# Patient Record
Sex: Male | Born: 2000 | Race: White | Hispanic: No | Marital: Single | Smoking: Never smoker
Health system: Southern US, Community
[De-identification: ages and names within clinical notes are randomized; demographics above are authoritative.]

## PROBLEM LIST (undated history)

## (undated) ENCOUNTER — Emergency Department (HOSPITAL_BASED_OUTPATIENT_CLINIC_OR_DEPARTMENT_OTHER): Admission: EM | Discharge status: Absent without leave | Payer: Managed Care, Other (non HMO)

## (undated) DIAGNOSIS — L309 Dermatitis, unspecified: Secondary | ICD-10-CM

## (undated) DIAGNOSIS — F419 Anxiety disorder, unspecified: Secondary | ICD-10-CM

## (undated) DIAGNOSIS — T7840XA Allergy, unspecified, initial encounter: Secondary | ICD-10-CM

## (undated) DIAGNOSIS — R011 Cardiac murmur, unspecified: Secondary | ICD-10-CM

## (undated) HISTORY — DX: Allergy, unspecified, initial encounter: T78.40XA

## (undated) HISTORY — DX: Anxiety disorder, unspecified: F41.9

## (undated) HISTORY — DX: Cardiac murmur, unspecified: R01.1

## (undated) HISTORY — DX: Dermatitis, unspecified: L30.9

---
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
## 2019-07-07 ENCOUNTER — Other Ambulatory Visit (HOSPITAL_BASED_OUTPATIENT_CLINIC_OR_DEPARTMENT_OTHER): Payer: Self-pay | Admitting: Family Medicine

## 2019-07-07 DIAGNOSIS — N50812 Left testicular pain: Secondary | ICD-10-CM

## 2019-07-07 DIAGNOSIS — N5089 Other specified disorders of the male genital organs: Secondary | ICD-10-CM

## 2019-07-09 ENCOUNTER — Ambulatory Visit (HOSPITAL_BASED_OUTPATIENT_CLINIC_OR_DEPARTMENT_OTHER)
Admission: RE | Admit: 2019-07-09 | Discharge: 2019-07-09 | Disposition: A | Discharge status: Home / Self Care | Payer: Managed Care, Other (non HMO) | Source: Ambulatory Visit | Attending: Family Medicine | Admitting: Family Medicine

## 2019-07-09 ENCOUNTER — Other Ambulatory Visit: Payer: Self-pay

## 2019-07-09 DIAGNOSIS — N5089 Other specified disorders of the male genital organs: Secondary | ICD-10-CM | POA: Diagnosis present

## 2019-07-09 DIAGNOSIS — N50812 Left testicular pain: Secondary | ICD-10-CM | POA: Insufficient documentation

## 2020-12-05 IMAGING — US US SCROTUM W/ DOPPLER COMPLETE
1 series · 14 of 25 positions shown · non-contrast
Comparison: None.

CLINICAL DATA: Spur attic bilateral testicular pain for 5 days.

EXAM:
SCROTAL ULTRASOUND
DOPPLER ULTRASOUND OF THE TESTICLES
TECHNIQUE: Complete ultrasound examination of the testicles, epididymis, and
other scrotal structures was performed. Color and spectral Doppler
ultrasound were also utilized to evaluate blood flow to the
testicles.

[Series 1: us scrotum w/ doppler complete · 14 of 59 slices shown]
[im 1/59]
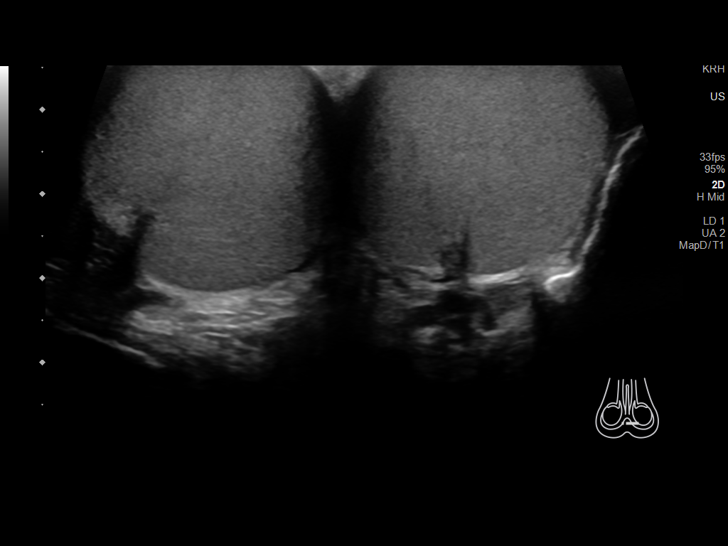
[im 5/59]
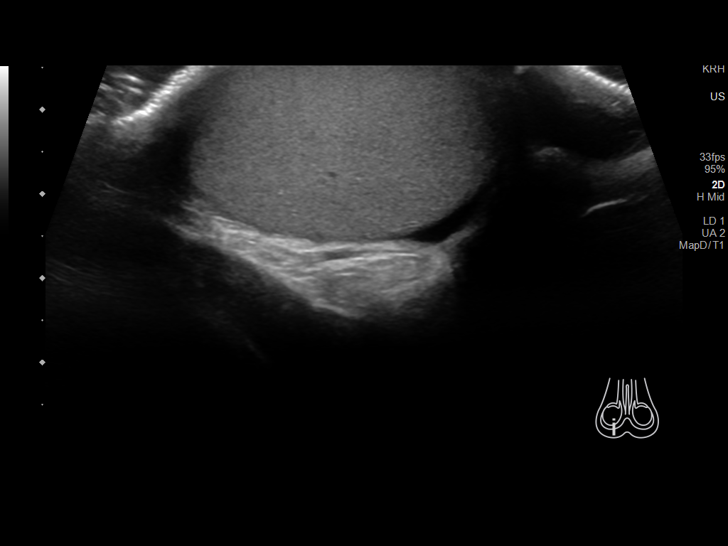
[im 10/59]
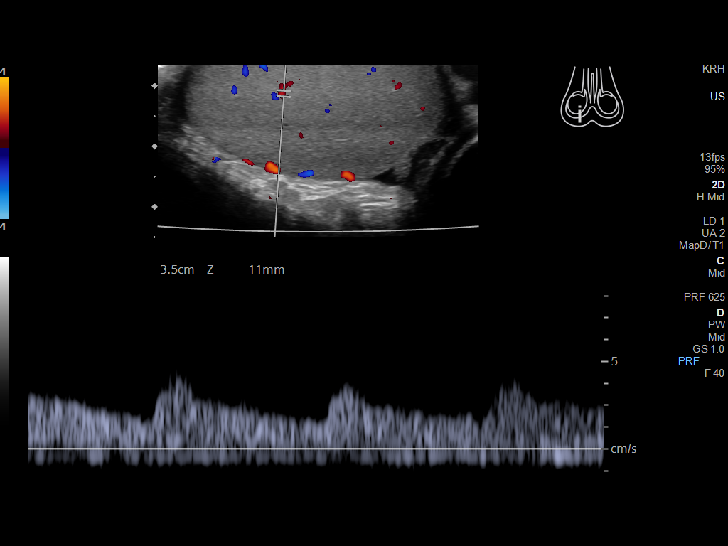
[im 15/59]
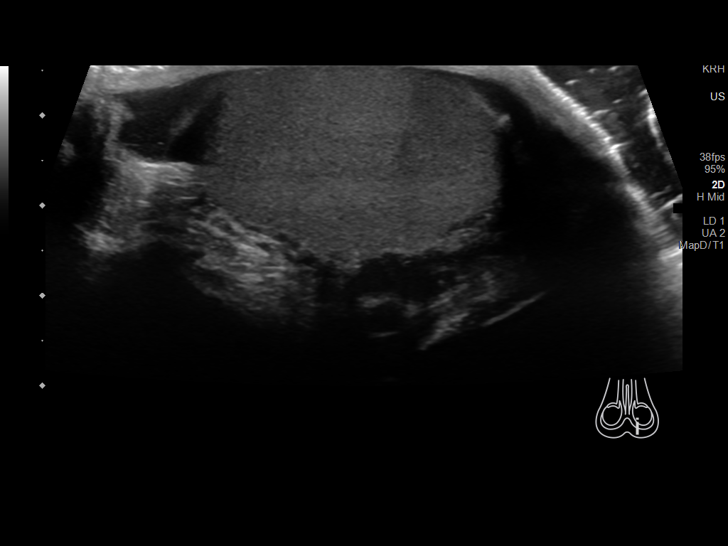
[im 20/59]
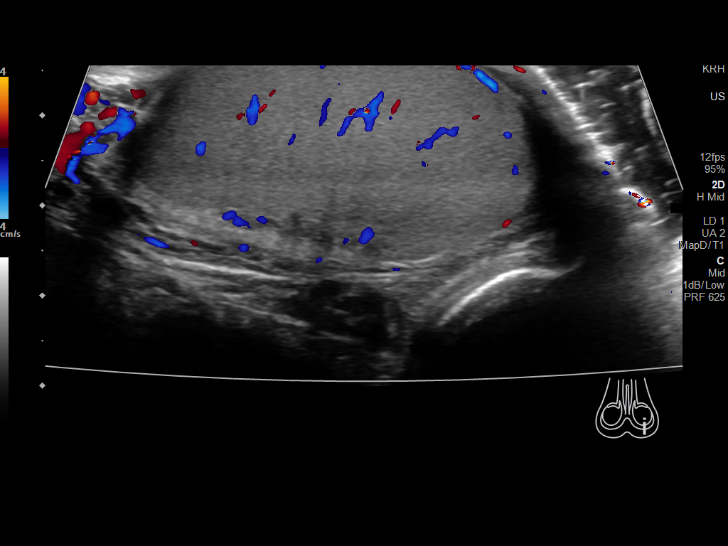
[im 22/59]
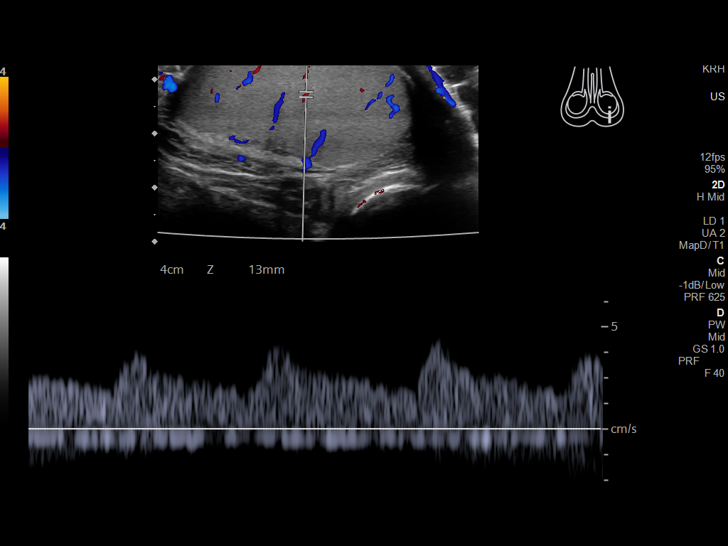
[im 27/59]
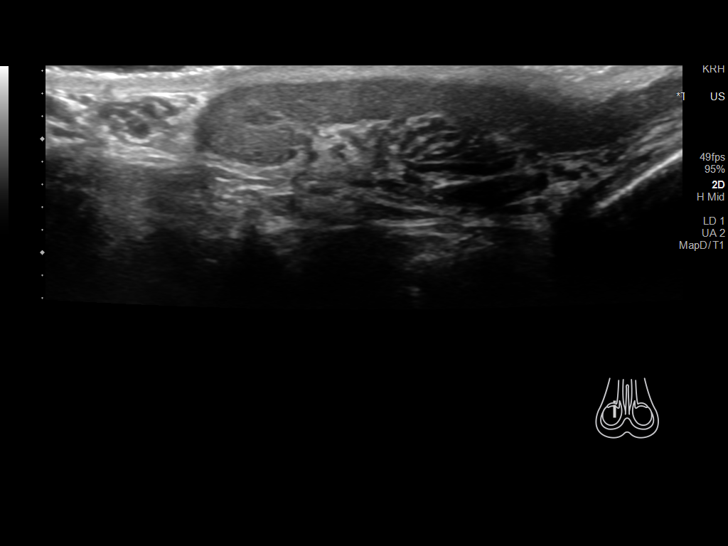
[im 32/59]
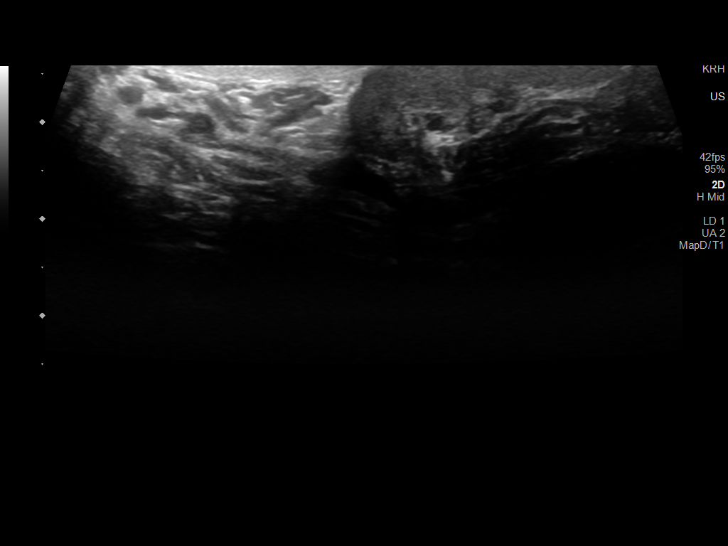
[im 37/59]
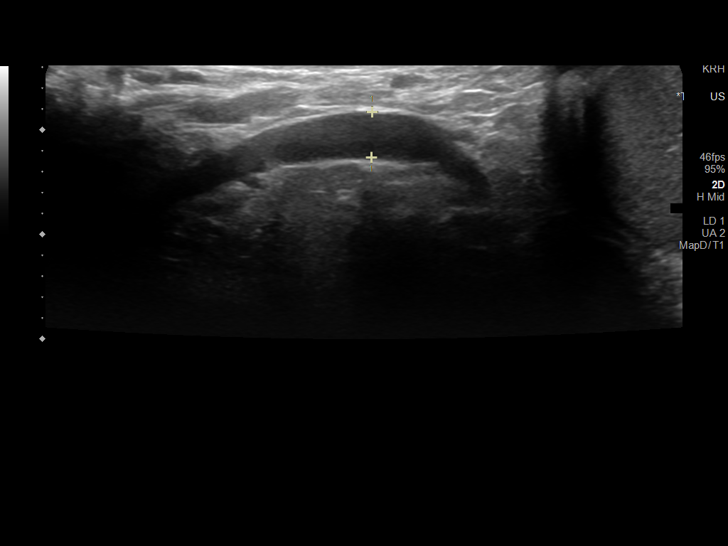
[im 39/59]
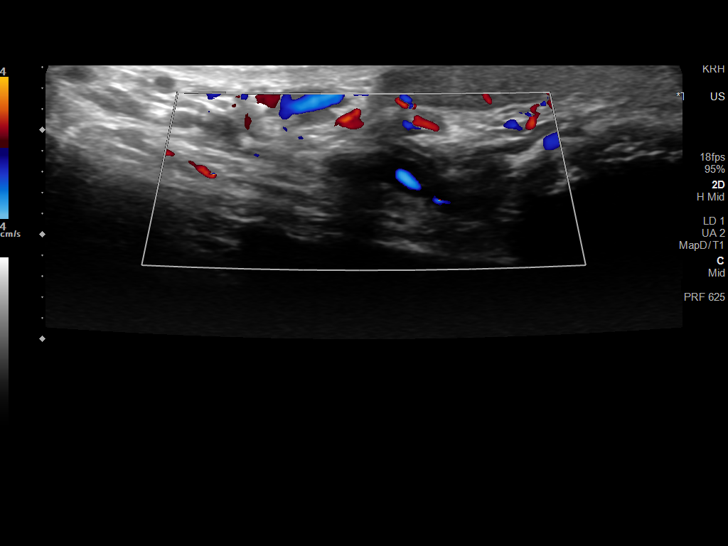
[im 44/59]
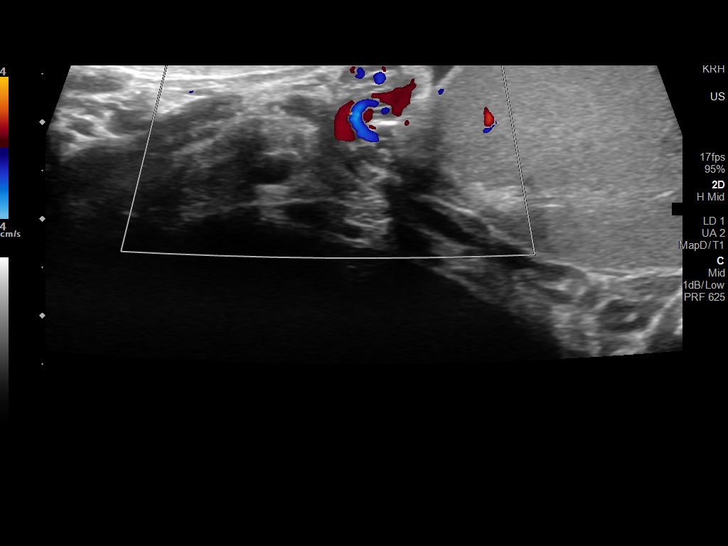
[im 49/59]
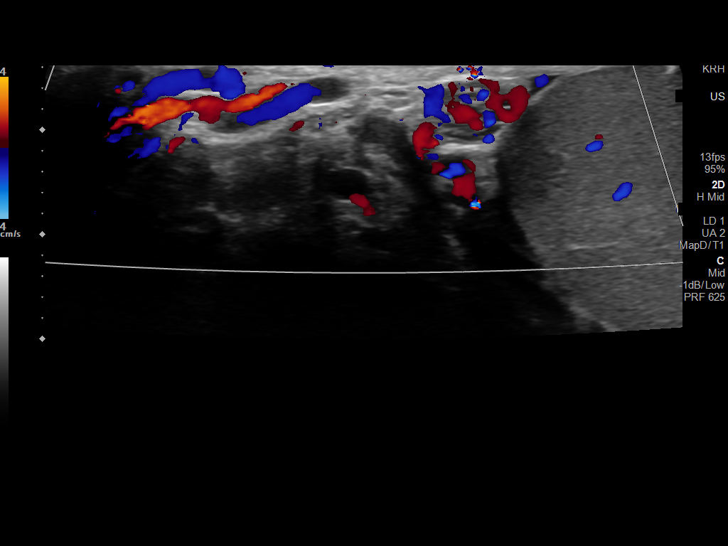
[im 54/59]
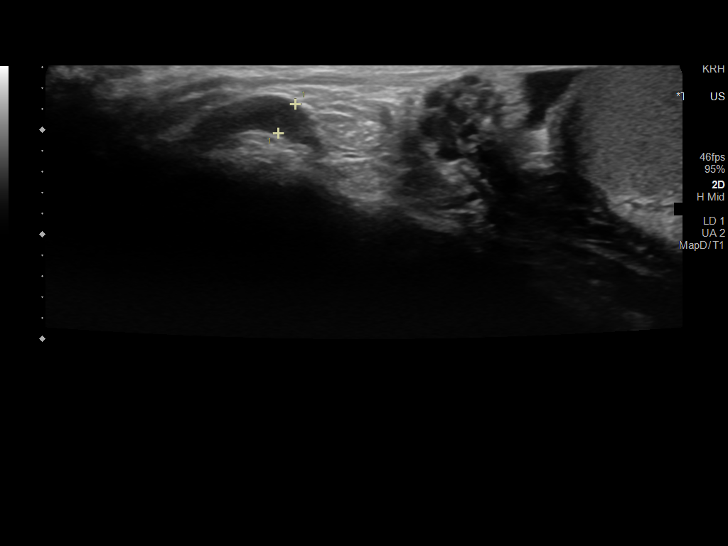
[im 59/59]
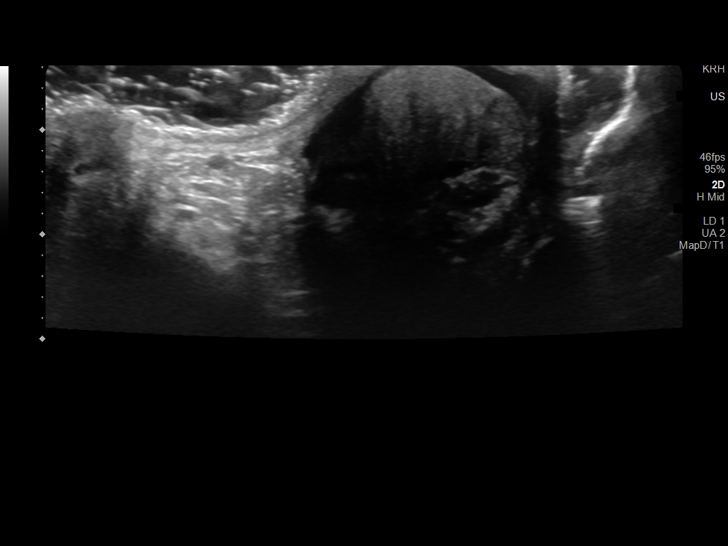

[14 of 25 positions shown; findings below may reference images not displayed]

FINDINGS: Right testicle

Measurements: 4.6 x 2.6 x 3.1 cm. Symmetric and homogeneous
echotexture without focal lesion. Patent arterial and venous blood
flow.

Left testicle

Measurements: 4.7 x 2.7 x 3.0 cm. Symmetric and homogeneous
echotexture without focal lesion. Patent arterial and venous blood
flow.

Right epididymis:  Normal in size and appearance.

Left epididymis:  Normal in size and appearance.

Hydrocele:  Small hydrocele on the left.

Varicocele:  Bilateral varicoceles.

Pulsed Doppler interrogation of both testes demonstrates normal low
resistance arterial and venous waveforms bilaterally.
IMPRESSION: 1. Normal sonographic appearance of both testicles.
2. Bilateral varicoceles and small left-sided hydrocele.

## 2022-04-17 ENCOUNTER — Ambulatory Visit (INDEPENDENT_AMBULATORY_CARE_PROVIDER_SITE_OTHER): Payer: BC Managed Care – PPO | Admitting: Medical

## 2022-04-17 ENCOUNTER — Encounter: Payer: Self-pay | Admitting: Medical

## 2022-04-17 VITALS — BP 130/78 | HR 75 | Temp 97.8°F | Resp 18 | Ht 69.0 in | Wt 151.6 lb

## 2022-04-17 DIAGNOSIS — R0789 Other chest pain: Secondary | ICD-10-CM

## 2022-04-17 DIAGNOSIS — R5383 Other fatigue: Secondary | ICD-10-CM

## 2022-04-17 DIAGNOSIS — R002 Palpitations: Secondary | ICD-10-CM

## 2022-04-17 DIAGNOSIS — F419 Anxiety disorder, unspecified: Secondary | ICD-10-CM | POA: Diagnosis not present

## 2022-04-17 NOTE — Progress Notes (Signed)
Subjective:    Patient ID: Russell Stevens, male    DOB: [birth year], 21 y.o.   MRN: [id number]  HPI Pt in for first time.  Pt works for [employer] [place]. Works collections. On job training. He exercises 5-6 days a week. He states eats healthy.  No alcohol. Non smoker. Smoked marijuana in college. He stopped smoking marijuana in past. Stopped edible 2 months. Some acid and mushrooms in college.   No coffee and no sodas.   [employer]- bachelor of science in psychology..    Pt states he gets palpitation on and off 2 moths ago. When this occurs feels like heart beat in throat.  Occurs once every other week.  Pt states chest pain on and off since January. Twice a weeks. Pain brief and transient chest pain at rest and with activity. At most last 5-10 minutes. No cocaine use.  Last times had chest pain was 2 days ago. Last time had palpitation was 2 weeks ago.  Pt states he feels anxiety but not have heart symptoms associated anxiety.  Also have some eczema. He also has eczema on elbos.     Review of Systems  Constitutional:  Positive for fatigue. Negative for chills and fever.       Mild tired.  Respiratory:  Negative for cough, choking, shortness of breath and wheezing.   Cardiovascular:  Negative for chest pain and palpitations.  Gastrointestinal:  Negative for blood in stool, diarrhea and rectal pain.  Genitourinary:  Negative for dysuria and flank pain.  Musculoskeletal:  Negative for back pain, gait problem and joint swelling.  Skin:  Negative for rash.  Neurological:  Negative for dizziness, light-headedness and headaches.  Hematological:  Negative for adenopathy. Does not bruise/bleed easily.  Psychiatric/Behavioral:  Negative for agitation, behavioral problems, sleep disturbance and suicidal ideas. The patient is nervous/anxious.     Past Medical History:  Diagnosis Date   Allergy    Anxiety    Eczema    Heart murmur      Social History   Socioeconomic History    Marital status: Single    Spouse name: Not on file   Number of children: Not on file   Years of education: Not on file   Highest education level: Bachelor's degree (e.g., BA, AB, BS)  Occupational History   Not on file  Tobacco Use   Smoking status: Never   Smokeless tobacco: Not on file  Vaping Use   Vaping Use: Never used  Substance and Sexual Activity   Alcohol use: Not Currently   Drug use: Not Currently    Types: LSD, Marijuana   Sexual activity: Yes    Birth control/protection: Condom  Other Topics Concern   Not on file  Social History Narrative   Not on file   Social Determinants of Health   Financial Resource Strain: Not on file  Food Insecurity: Not on file  Transportation Needs: Not on file  Physical Activity: Not on file  Stress: Not on file  Social Connections: Not on file  Intimate Partner Violence: Not on file    History reviewed. No pertinent surgical history.  Family History  Problem Relation Age of Onset   Depression Mother    Hypertension Father    Hypertension Maternal Grandmother    Cancer Maternal Grandmother    Hypertension Maternal Grandfather    Cancer Maternal Grandfather    Hypertension Paternal Grandmother    Hyperlipidemia Paternal Grandfather    Hypertension Paternal Actor  Allergies  Allergen Reactions   Amoxicillin-Pot Clavulanate Other (See Comments)    diarrhea   Peanut (Diagnostic) Rash    Per mom Per mom     Current Outpatient Medications on File Prior to Visit  Medication Sig Dispense Refill   cholecalciferol (VITAMIN D3) 25 MCG (1000 UNIT) tablet Take 1,000 Units by mouth daily.     Magnesium Bisglycinate (MAG GLYCINATE PO) Take by mouth.     No current facility-administered medications on file prior to visit.    BP 130/78 (BP Location: Left Arm, Patient Position: Sitting, Cuff Size: Normal)   Pulse 75   Temp 97.8 F (36.6 C) (Temporal)   Resp 18   Ht 5\' 9"  (1.753 m)   Wt 151 lb 9.6 oz (68.8 kg)    SpO2 98%   BMI 22.39 kg/m        Objective:   Physical Exam   General- No acute distress. Pleasant patient. Neck- Full range of motion, no jvd Lungs- Clear, even and unlabored. Heart- regular rate and rhythm. Neurologic- CNII- XII grossly intact.      Assessment & Plan:   Patient Instructions  For atypical chest pain and intermittent palpitation will get ahead and refer you to cardiologist. Avoid any caffeine. If symptoms recurrent prior to cardiology evaluation be seen here, [facility] or ED  For anxiety- offered low dose prescription. You declined so do think counselor would be good idea. Let me know if you change your mind.  For family histor of thyroid disease and mild tired/fatigue placed labs.  Follow up in 2-3 weeks or sooner if needed.

## 2022-04-17 NOTE — Patient Instructions (Addendum)
For atypical chest pain and intermittent palpitation will get ahead and refer you to cardiologist. Avoid any caffeine. If symptoms recurrent prior to cardiology evaluation be seen here, [facility] or ED  Ekg nsr today.  For anxiety- offered low dose prescription. You declined so do think counselor would be good idea. Let me know if you change your mind.  For family history of thyroid disease and mild tired/fatigue placed labs.  Follow up in 2-3 weeks or sooner if needed.

## 2022-04-17 NOTE — Addendum Note (Signed)
Addended by: Mervin Kung A on: 04/17/2022 03:15 PM   Modules accepted: Orders

## 2022-04-18 LAB — CBC WITH DIFFERENTIAL/PLATELET
Absolute Monocytes: 286 cells/uL (ref 200–950)
Basophils Absolute: 20 cells/uL (ref 0–200)
Basophils Relative: 0.4 %
Eosinophils Absolute: 82 cells/uL (ref 15–500)
Eosinophils Relative: 1.6 %
HCT: 43.6 % (ref 38.5–50.0)
Hemoglobin: 14.9 g/dL (ref 13.2–17.1)
Lymphs Abs: 2224 cells/uL (ref 850–3900)
MCH: 29.4 pg (ref 27.0–33.0)
MCHC: 34.2 g/dL (ref 32.0–36.0)
MCV: 86 fL (ref 80.0–100.0)
MPV: 9.9 fL (ref 7.5–12.5)
Monocytes Relative: 5.6 %
Neutro Abs: 2489 cells/uL (ref 1500–7800)
Neutrophils Relative %: 48.8 %
Platelets: 253 10*3/uL (ref 140–400)
RBC: 5.07 10*6/uL (ref 4.20–5.80)
RDW: 13 % (ref 11.0–15.0)
Total Lymphocyte: 43.6 %
WBC: 5.1 10*3/uL (ref 3.8–10.8)

## 2022-04-18 LAB — COMPREHENSIVE METABOLIC PANEL
AG Ratio: 1.9 (calc) (ref 1.0–2.5)
ALT: 13 U/L (ref 9–46)
AST: 13 U/L (ref 10–40)
Albumin: 4.5 g/dL (ref 3.6–5.1)
Alkaline phosphatase (APISO): 86 U/L (ref 36–130)
BUN: 14 mg/dL (ref 7–25)
CO2: 28 mmol/L (ref 20–32)
Calcium: 9.4 mg/dL (ref 8.6–10.3)
Chloride: 103 mmol/L (ref 98–110)
Creat: 0.77 mg/dL (ref 0.60–1.24)
Globulin: 2.4 g/dL (calc) (ref 1.9–3.7)
Glucose, Bld: 68 mg/dL (ref 65–99)
Potassium: 4.2 mmol/L (ref 3.5–5.3)
Sodium: 140 mmol/L (ref 135–146)
Total Bilirubin: 0.5 mg/dL (ref 0.2–1.2)
Total Protein: 6.9 g/dL (ref 6.1–8.1)

## 2022-04-18 LAB — TSH: TSH: 1.74 mIU/L (ref 0.40–4.50)

## 2022-04-18 LAB — T4, FREE: Free T4: 1.4 ng/dL (ref 0.8–1.8)

## 2022-05-05 ENCOUNTER — Ambulatory Visit: Payer: BC Managed Care – PPO | Attending: Internal Medicine | Admitting: Internal Medicine

## 2022-05-05 VITALS — BP 118/84 | HR 70 | Ht 69.0 in | Wt 149.2 lb

## 2022-05-05 DIAGNOSIS — R079 Chest pain, unspecified: Secondary | ICD-10-CM | POA: Diagnosis not present

## 2022-05-05 NOTE — Progress Notes (Signed)
Cardiology Office Note:    Date:  05/05/2022   ID:  Russell Stevens, DOB Feb 09, 2001, MRN [id number]  PCP:  Saguier, Edward, [place] [facility] Cardiologist:  None     Referring MD: Mackie Pai, PA-C   No chief complaint on file. Atypical CP  History of Present Illness:    Russell Stevens is a 21 y.o. male with no cardiac hx, referral for palpitations and atypical intermittent CP. He has anxious signs.He notes some fluttering. He was having panic attacks. He is working on therapy. Qtc 384 ms. No syncope  Past Medical History:  Diagnosis Date   Allergy    Anxiety    Eczema    Heart murmur     No past surgical history on file.  Current Medications: No outpatient medications have been marked as taking for the 05/05/22 encounter (Appointment) with Janina Mayo, MD.     Allergies:   Amoxicillin-pot clavulanate and Peanut (diagnostic)   Social History   Socioeconomic History   Marital status: Single    Spouse name: Not on file   Number of children: Not on file   Years of education: Not on file   Highest education level: Bachelor's degree (e.g., BA, AB, BS)  Occupational History   Not on file  Tobacco Use   Smoking status: Never   Smokeless tobacco: Not on file  Vaping Use   Vaping Use: Never used  Substance and Sexual Activity   Alcohol use: Not Currently   Drug use: Not Currently    Types: LSD, Marijuana   Sexual activity: Yes    Birth control/protection: Condom  Other Topics Concern   Not on file  Social History Narrative   Not on file   Social Determinants of Health   Financial Resource Strain: Not on file  Food Insecurity: Not on file  Transportation Needs: Not on file  Physical Activity: Not on file  Stress: Not on file  Social Connections: Not on file     Family History: The patient's family history includes Cancer in his maternal grandfather and maternal grandmother; Depression in his mother; Hyperlipidemia in his paternal  grandfather; Hypertension in his father, maternal grandfather, maternal grandmother, paternal grandfather, and paternal grandmother. No family hx of CAD  ROS:   Please see the history of present illness.     All other systems reviewed and are negative.  EKGs/Labs/Other Studies Reviewed:    The following studies were reviewed today:  EKG:  EKG is  ordered today.  The ekg ordered today demonstrates   10/3-NSR  Recent Labs: 04/17/2022: ALT 13; BUN 14; Creat 0.77; Hemoglobin 14.9; Platelets 253; Potassium 4.2; Sodium 140; TSH 1.74   Recent Lipid Panel No results found for: "CHOL", "TRIG", "HDL", "CHOLHDL", "VLDL", "LDLCALC", "LDLDIRECT"   Risk Assessment/Calculations:     Physical Exam:    VS:  Vitals:   05/05/22 0914 05/05/22 0915  BP: 118/84 118/84  Pulse:  70     Wt Readings from Last 3 Encounters:  04/17/22 151 lb 9.6 oz (68.8 kg)  12/22/12 116 lb (52.6 kg) (68 %, Z= 0.46)*  10/15/11 100 lb (45.4 kg) (66 %, Z= 0.41)*   * Growth percentiles are based on CDC (Boys, 2-20 Years) data.     GEN:  Well nourished, well developed in no acute distress HEENT: Normal NECK: No JVD; No carotid bruits LYMPHATICS: No lymphadenopathy CARDIAC: RRR, no murmurs, rubs, gallops RESPIRATORY:  Clear to auscultation without rales, wheezing or rhonchi  ABDOMEN:  Soft, non-tender, non-distended MUSCULOSKELETAL:  No edema; No deformity  SKIN: Warm and dry NEUROLOGIC:  Alert and oriented x 3 PSYCHIATRIC:  Normal affect   ASSESSMENT:    Non cardiac CP: has intermittent CP. The nature of the pain is very atypical. Her EKG today was normal. Consider the low pretest probability and atypical pain will not recommend further testing.  Palpitations: He does not have high risk features including syncope c/f arrhythmia , family hx of SCD, or abnormalities on her EKG.  PLAN:    In order of problems listed above:  No further cardiac w/u           Medication Adjustments/Labs and Tests  Ordered: Current medicines are reviewed at length with the patient today.  Concerns regarding medicines are outlined above.  No orders of the defined types were placed in this encounter.  No orders of the defined types were placed in this encounter.   There are no Patient Instructions on file for this visit.   Signed, Maisie Fus, MD  05/05/2022 7:49 AM    [facility]

## 2022-05-05 NOTE — Patient Instructions (Signed)
Medication Instructions:  NO CHANGES  *If you need a refill on your cardiac medications before your next appointment, please call your pharmacy*    Follow-Up: At [facility], you and your health needs are our priority.  As part of our continuing mission to provide you with exceptional heart care, we have created designated Provider Care Teams.  These Care Teams include your primary Cardiologist (physician) and Advanced Practice Providers (APPs -  Physician Assistants and Nurse Practitioners) who all work together to provide you with the care you need, when you need it.  We recommend signing up for the patient portal called "MyChart".  Sign up information is provided on this After Visit Summary.  MyChart is used to connect with patients for Virtual Visits (Telemedicine).  Patients are able to view lab/test results, encounter notes, upcoming appointments, etc.  Non-urgent messages can be sent to your provider as well.   To learn more about what you can do with MyChart, go to [email or web address].    Your next appointment:    AS NEEDED with Dr. Phineas Inches

## 2022-06-01 DIAGNOSIS — F411 Generalized anxiety disorder: Secondary | ICD-10-CM | POA: Diagnosis not present

## 2022-06-29 DIAGNOSIS — F411 Generalized anxiety disorder: Secondary | ICD-10-CM | POA: Diagnosis not present

## 2022-07-20 DIAGNOSIS — F411 Generalized anxiety disorder: Secondary | ICD-10-CM | POA: Diagnosis not present

## 2022-08-03 DIAGNOSIS — F411 Generalized anxiety disorder: Secondary | ICD-10-CM | POA: Diagnosis not present

## 2022-08-17 DIAGNOSIS — F411 Generalized anxiety disorder: Secondary | ICD-10-CM | POA: Diagnosis not present

## 2022-08-31 DIAGNOSIS — F411 Generalized anxiety disorder: Secondary | ICD-10-CM | POA: Diagnosis not present

## 2022-09-12 DIAGNOSIS — S0101XA Laceration without foreign body of scalp, initial encounter: Secondary | ICD-10-CM | POA: Diagnosis not present

## 2022-09-12 DIAGNOSIS — S0990XA Unspecified injury of head, initial encounter: Secondary | ICD-10-CM | POA: Diagnosis not present

## 2022-09-14 DIAGNOSIS — F411 Generalized anxiety disorder: Secondary | ICD-10-CM | POA: Diagnosis not present

## 2022-09-28 DIAGNOSIS — F411 Generalized anxiety disorder: Secondary | ICD-10-CM | POA: Diagnosis not present

## 2022-10-12 DIAGNOSIS — F411 Generalized anxiety disorder: Secondary | ICD-10-CM | POA: Diagnosis not present

## 2022-10-26 DIAGNOSIS — F411 Generalized anxiety disorder: Secondary | ICD-10-CM | POA: Diagnosis not present

## 2022-11-09 DIAGNOSIS — F411 Generalized anxiety disorder: Secondary | ICD-10-CM | POA: Diagnosis not present

## 2022-11-23 DIAGNOSIS — F411 Generalized anxiety disorder: Secondary | ICD-10-CM | POA: Diagnosis not present

## 2022-12-07 DIAGNOSIS — F411 Generalized anxiety disorder: Secondary | ICD-10-CM | POA: Diagnosis not present

## 2022-12-21 DIAGNOSIS — F411 Generalized anxiety disorder: Secondary | ICD-10-CM | POA: Diagnosis not present

## 2023-01-04 DIAGNOSIS — F411 Generalized anxiety disorder: Secondary | ICD-10-CM | POA: Diagnosis not present

## 2023-01-18 DIAGNOSIS — F411 Generalized anxiety disorder: Secondary | ICD-10-CM | POA: Diagnosis not present

## 2024-08-22 ENCOUNTER — Ambulatory Visit: Payer: Self-pay | Admitting: Medical

## 2024-08-22 ENCOUNTER — Ambulatory Visit: Admitting: Medical

## 2024-08-22 VITALS — BP 112/78 | HR 79 | Temp 98.3°F | Resp 16 | Ht 69.0 in | Wt 165.6 lb

## 2024-08-22 DIAGNOSIS — L210 Seborrhea capitis: Secondary | ICD-10-CM

## 2024-08-22 DIAGNOSIS — Z0000 Encounter for general adult medical examination without abnormal findings: Secondary | ICD-10-CM

## 2024-08-22 LAB — CBC WITH DIFFERENTIAL/PLATELET
Basophils Absolute: 0 K/uL (ref 0.0–0.1)
Basophils Relative: 0.3 % (ref 0.0–3.0)
Eosinophils Absolute: 0.1 K/uL (ref 0.0–0.7)
Eosinophils Relative: 1.8 % (ref 0.0–5.0)
HCT: 45.6 % (ref 39.0–52.0)
Hemoglobin: 15.7 g/dL (ref 13.0–17.0)
Lymphocytes Relative: 37.9 % (ref 12.0–46.0)
Lymphs Abs: 2 K/uL (ref 0.7–4.0)
MCHC: 34.5 g/dL (ref 30.0–36.0)
MCV: 84.1 fl (ref 78.0–100.0)
Monocytes Absolute: 0.3 K/uL (ref 0.1–1.0)
Monocytes Relative: 5 % (ref 3.0–12.0)
Neutro Abs: 2.9 K/uL (ref 1.4–7.7)
Neutrophils Relative %: 55 % (ref 43.0–77.0)
Platelets: 263 K/uL (ref 150.0–400.0)
RBC: 5.42 Mil/uL (ref 4.22–5.81)
RDW: 12.7 % (ref 11.5–15.5)
WBC: 5.3 K/uL (ref 4.0–10.5)

## 2024-08-22 LAB — LIPID PANEL
Cholesterol: 205 mg/dL — ABNORMAL HIGH (ref 28–200)
HDL: 37.2 mg/dL — ABNORMAL LOW
LDL Cholesterol: 144 mg/dL — ABNORMAL HIGH (ref 10–99)
NonHDL: 168.23
Total CHOL/HDL Ratio: 6
Triglycerides: 121 mg/dL (ref 10.0–149.0)
VLDL: 24.2 mg/dL (ref 0.0–40.0)

## 2024-08-22 LAB — COMPREHENSIVE METABOLIC PANEL WITH GFR
ALT: 30 U/L (ref 3–53)
AST: 18 U/L (ref 5–37)
Albumin: 4.8 g/dL (ref 3.5–5.2)
Alkaline Phosphatase: 87 U/L (ref 39–117)
BUN: 10 mg/dL (ref 6–23)
CO2: 30 meq/L (ref 19–32)
Calcium: 9.6 mg/dL (ref 8.4–10.5)
Chloride: 104 meq/L (ref 96–112)
Creatinine, Ser: 0.84 mg/dL (ref 0.40–1.50)
GFR: 121.54 mL/min
Glucose, Bld: 84 mg/dL (ref 70–99)
Potassium: 4.2 meq/L (ref 3.5–5.1)
Sodium: 140 meq/L (ref 135–145)
Total Bilirubin: 1 mg/dL (ref 0.2–1.2)
Total Protein: 7.5 g/dL (ref 6.0–8.3)

## 2024-08-22 NOTE — Patient Instructions (Addendum)
 For you wellness exam today I have ordered cbc, cmp and lipid panel.  Vaccine declined state flu and covid will get thru pharmacy.  Recommend exercise and healthy diet.  We will let you know lab results as they come in.  Follow up date appointment will be determined after lab review.     Preventive Care 11-26 Years Old, Male Preventive care refers to lifestyle choices and visits with your health care provider that can promote health and wellness. Preventive care visits are also called wellness exams. What can I expect for my preventive care visit? Counseling During your preventive care visit, your health care provider may ask about your: Medical history, including: Past medical problems. Family medical history. Current health, including: Emotional well-being. Home life and relationship well-being. Sexual activity. Lifestyle, including: Alcohol, nicotine or tobacco, and drug use. Access to firearms. Diet, exercise, and sleep habits. Safety issues such as seatbelt and bike helmet use. Sunscreen use. Work and work astronomer. Physical exam Your health care provider may check your: Height and weight. These may be used to calculate your BMI (body mass index). BMI is a measurement that tells if you are at a healthy weight. Waist circumference. This measures the distance around your waistline. This measurement also tells if you are at a healthy weight and may help predict your risk of certain diseases, such as type 2 diabetes and high blood pressure. Heart rate and blood pressure. Body temperature. Skin for abnormal spots. What immunizations do I need?  Vaccines are usually given at various ages, according to a schedule. Your health care provider will recommend vaccines for you based on your age, medical history, and lifestyle or other factors, such as travel or where you work. What tests do I need? Screening Your health care provider may recommend screening tests for certain  conditions. This may include: Lipid and cholesterol levels. Diabetes screening. This is done by checking your blood sugar (glucose) after you have not eaten for a while (fasting). Hepatitis B test. Hepatitis C test. HIV (human immunodeficiency virus) test. STI (sexually transmitted infection) testing, if you are at risk. Talk with your health care provider about your test results, treatment options, and if necessary, the need for more tests. Follow these instructions at home: Eating and drinking  Eat a healthy diet that includes fresh fruits and vegetables, whole grains, lean protein, and low-fat dairy products. Drink enough fluid to keep your urine pale yellow. Take vitamin and mineral supplements as recommended by your health care provider. Do not drink alcohol if your health care provider tells you not to drink. If you drink alcohol: Limit how much you have to 0-2 drinks a day. Know how much alcohol is in your drink. In the [place] one drink equals one 12 oz bottle of beer (355 mL), one 5 oz glass of wine (148 mL), or one 1 oz glass of hard liquor (44 mL). Lifestyle Brush your teeth every morning and night with fluoride toothpaste. Floss one time each day. Exercise for at least 30 minutes 5 or more days each week. Do not use any products that contain nicotine or tobacco. These products include cigarettes, chewing tobacco, and vaping devices, such as e-cigarettes. If you need help quitting, ask your health care provider. Do not use drugs. If you are sexually active, practice safe sex. Use a condom or other form of protection to prevent STIs. Find healthy ways to manage stress, such as: Meditation, yoga, or listening to music. Journaling. Talking to a trusted person.  Spending time with friends and family. Minimize exposure to UV radiation to reduce your risk of skin cancer. Safety Always wear your seat belt while driving or riding in a vehicle. Do not drive: If you have been drinking  alcohol. Do not ride with someone who has been drinking. If you have been using any mind-altering substances or drugs. While texting. When you are tired or distracted. Wear a helmet and other protective equipment during sports activities. If you have firearms in your house, make sure you follow all gun safety procedures. Seek help if you have been physically or sexually abused. What's next? Go to your health care provider once a year for an annual wellness visit. Ask your health care provider how often you should have your eyes and teeth checked. Stay up to date on all vaccines. This information is not intended to replace advice given to you by your health care provider. Make sure you discuss any questions you have with your health care provider. Document Revised: 01/15/2021 Document Reviewed: 01/15/2021 Elsevier Patient Education  2024 [employer].

## 2024-08-22 NOTE — Addendum Note (Signed)
 Addended by: DORINA DALLAS DORINA PA-C M on: 08/22/2024 10:50 AM   Modules accepted: Orders

## 2024-08-22 NOTE — Progress Notes (Addendum)
 "  Subjective:    Patient ID: Russell Stevens, male    DOB: 05/12/2001, 24 y.o.   MRN: [id number]  HPI Pt in for wellness exam   Pt works for [employer]. Works collections.  Not exercising presently. He states eats moderate healthy.  No alcohol. Non smoker. No drug use on review    Coffees occasional and no sodas.    [employer]- barista in psychology.  Pt states will get flu vaccine at pharmacy. Plans to do covid at pharmacy as well.    08-29-2024. Opened up chart and deleted portion that did not apply to pt. Somehow accidentally past item in patient charge. So deleting. Also checked making sure pt did not have similar clnical presentation. He states not.        Review of Systems  Constitutional:  Negative for chills, fatigue and fever.  HENT:  Negative for congestion and ear pain.   Respiratory:  Negative for chest tightness, shortness of breath and wheezing.   Cardiovascular:  Negative for chest pain and palpitations.  Gastrointestinal:  Negative for abdominal pain, blood in stool, diarrhea, nausea and vomiting.  Genitourinary:  Negative for dysuria, frequency and testicular pain.  Musculoskeletal:  Negative for back pain, myalgias and neck stiffness.  Skin:  Negative for rash.       Dandruff. Has tried various over the counter measures but has not helped. Also dry flaky skin on face. Some scaly dryness in eye brows as well.  Neurological:  Negative for dizziness, seizures, syncope, weakness and headaches.  Hematological:  Negative for adenopathy.  Psychiatric/Behavioral:  Negative for behavioral problems, dysphoric mood and suicidal ideas. The patient is not nervous/anxious.      Past Medical History:  Diagnosis Date   Allergy    Anxiety    Eczema    Heart murmur      Social History   Socioeconomic History   Marital status: Single    Spouse name: Not on file   Number of children: Not on file   Years of education: Not on file   Highest education  level: Bachelor's degree (e.g., BA, AB, BS)  Occupational History   Not on file  Tobacco Use   Smoking status: Never   Smokeless tobacco: Not on file  Vaping Use   Vaping status: Never Used  Substance and Sexual Activity   Alcohol use: Not Currently   Drug use: Not Currently    Types: LSD, Marijuana   Sexual activity: Yes    Birth control/protection: Condom  Other Topics Concern   Not on file  Social History Narrative   Not on file   Social Drivers of Health   Tobacco Use: Unknown (08/16/2024)   Patient History    Smoking Tobacco Use: Never    Smokeless Tobacco Use: Unknown    Passive Exposure: Not on file  Financial Resource Strain: Low Risk (08/16/2024)   Overall Financial Resource Strain (CARDIA)    Difficulty of Paying Living Expenses: Not very hard  Food Insecurity: No Food Insecurity (08/16/2024)   Epic    Worried About Programme Researcher, Broadcasting/film/video in the Last Year: Never true    Ran Out of Food in the Last Year: Never true  Transportation Needs: No Transportation Needs (08/16/2024)   Epic    Lack of Transportation (Medical): No    Lack of Transportation (Non-Medical): No  Physical Activity: Inactive (08/16/2024)   Exercise Vital Sign    Days of Exercise per Week: 0 days  Minutes of Exercise per Session: Not on file  Stress: Stress Concern Present (08/16/2024)   [employer] of Occupational Health - Occupational Stress Questionnaire    Feeling of Stress: To some extent  Social Connections: Socially Isolated (08/16/2024)   Social Connection and Isolation Panel    Frequency of Communication with Friends and Family: More than three times a week    Frequency of Social Gatherings with Friends and Family: Twice a week    Attends Religious Services: Never    Database Administrator or Organizations: No    Attends Engineer, Structural: Not on file    Marital Status: Never married  Intimate Partner Violence: Not on file  Depression (PHQ2-9): Medium Risk (08/22/2024)    Depression (PHQ2-9)    PHQ-2 Score: 9  Alcohol Screen: Low Risk (08/16/2024)   Alcohol Screen    Last Alcohol Screening Score (AUDIT): 2  Housing: Low Risk (08/16/2024)   Epic    Unable to Pay for Housing in the Last Year: No    Number of Times Moved in the Last Year: 0    Homeless in the Last Year: No  Utilities: Not on file  Health Literacy: Not on file    No past surgical history on file.  Family History  Problem Relation Age of Onset   Depression Mother    Hypertension Father    Hypertension Maternal Grandmother    Cancer Maternal Grandmother    Hypertension Maternal Grandfather    Cancer Maternal Grandfather    Hypertension Paternal Grandmother    Hyperlipidemia Paternal Grandfather    Hypertension Paternal Grandfather     Allergies[1]  Medications Ordered Prior to Encounter[2]  BP 112/78   Pulse 79   Temp 98.3 F (36.8 C) (Oral)   Resp 16   Ht 5' 9 (1.753 m)   Wt 165 lb 9.6 oz (75.1 kg)   SpO2 96%   BMI 24.45 kg/m           Objective:   Physical Exam  General Mental Status- Alert. General Appearance- Not in acute distress.   Skin General: Color- Normal Color. Moisture- Normal Moisture.  Neck Carotid Arteries- Normal color. Moisture- Normal Moisture. No carotid bruits. No JVD.  Chest and Lung Exam Auscultation: Breath Sounds:-Normal.  Cardiovascular Auscultation:Rythm- Regular. Murmurs & Other Heart Sounds:Auscultation of the heart reveals- No Murmurs.  Abdomen Inspection:-Inspeection Normal. Palpation/Percussion:Note:No mass. Palpation and Percussion of the abdomen reveal- Non Tender, Non Distended + BS, no rebound or guarding.   Neurologic Cranial Nerve exam:- CN III-XII intact(No nystagmus), symmetric smile. Strength:- 5/5 equal and symmetric strength both upper and lower extremities.       Assessment & Plan:   Patient Instructions  For you wellness exam today I have ordered cbc, cmp and lipid panel.  Vaccine declined state  flu and covid will get thru pharmacy.  Recommend exercise and healthy diet.  We will let you know lab results as they come in.  Follow up date appointment will be determined after lab review.     Dallas Maxwell, PA-C      [1]  Allergies Allergen Reactions   Amoxicillin-Pot Clavulanate Other (See Comments)    diarrhea   Peanut (Diagnostic) Rash    Per mom Per mom   [2]  Current Outpatient Medications on File Prior to Visit  Medication Sig Dispense Refill   cholecalciferol (VITAMIN D3) 25 MCG (1000 UNIT) tablet Take 1,000 Units by mouth daily. (Patient not taking: Reported on 08/22/2024)  Magnesium Bisglycinate (MAG GLYCINATE PO) Take by mouth. (Patient not taking: Reported on 08/22/2024)     No current facility-administered medications on file prior to visit.   "

## 2025-05-15 ENCOUNTER — Ambulatory Visit: Admitting: Physician Assistant
